# Patient Record
Sex: Female | Born: 1960 | Race: Black or African American | Hispanic: No | Marital: Married | Smoking: Never smoker
Health system: Southern US, Community
[De-identification: ages and names within clinical notes are randomized; demographics above are authoritative.]

## PROBLEM LIST (undated history)

## (undated) DIAGNOSIS — R748 Abnormal levels of other serum enzymes: Secondary | ICD-10-CM

## (undated) DIAGNOSIS — G4730 Sleep apnea, unspecified: Secondary | ICD-10-CM

## (undated) DIAGNOSIS — E739 Lactose intolerance, unspecified: Secondary | ICD-10-CM

## (undated) DIAGNOSIS — I10 Essential (primary) hypertension: Secondary | ICD-10-CM

## (undated) DIAGNOSIS — D649 Anemia, unspecified: Secondary | ICD-10-CM

## (undated) DIAGNOSIS — R3915 Urgency of urination: Secondary | ICD-10-CM

## (undated) DIAGNOSIS — Z8709 Personal history of other diseases of the respiratory system: Secondary | ICD-10-CM

## (undated) DIAGNOSIS — M75100 Unspecified rotator cuff tear or rupture of unspecified shoulder, not specified as traumatic: Secondary | ICD-10-CM

## (undated) DIAGNOSIS — M1990 Unspecified osteoarthritis, unspecified site: Secondary | ICD-10-CM

## (undated) HISTORY — PX: OTHER SURGICAL HISTORY: SHX169

## (undated) HISTORY — PX: HEMORRHOID SURGERY: SHX153

---

## 2004-11-29 HISTORY — PX: OTHER SURGICAL HISTORY: SHX169

## 2011-06-10 ENCOUNTER — Other Ambulatory Visit: Payer: Self-pay | Admitting: Surgical Oncology

## 2011-06-10 DIAGNOSIS — N6452 Nipple discharge: Secondary | ICD-10-CM

## 2011-07-01 ENCOUNTER — Ambulatory Visit
Admission: RE | Admit: 2011-07-01 | Discharge: 2011-07-01 | Disposition: A | Discharge status: Home / Self Care | Payer: BC Managed Care – PPO | Source: Ambulatory Visit | Attending: Surgical Oncology | Admitting: Surgical Oncology

## 2011-07-01 ENCOUNTER — Other Ambulatory Visit: Payer: Self-pay | Admitting: Surgical Oncology

## 2011-07-01 DIAGNOSIS — N6452 Nipple discharge: Secondary | ICD-10-CM

## 2014-09-05 ENCOUNTER — Ambulatory Visit: Payer: Self-pay | Admitting: Orthopedic Surgery

## 2014-09-06 ENCOUNTER — Encounter (HOSPITAL_COMMUNITY): Payer: Self-pay | Admitting: Pharmacy Technician

## 2014-09-09 ENCOUNTER — Ambulatory Visit: Payer: Self-pay | Admitting: Orthopedic Surgery

## 2014-09-09 NOTE — Patient Instructions (Addendum)
Olayinka Gathers  09/09/2014                           YOUR PROCEDURE IS SCHEDULED ON: 09/13/14                ENTER FROM [address] - GO TO [address]               LOOK FOR [facility]  / [employer]                              FOLLOW  SIGNS TO SHORT STAY CENTER                 ARRIVE AT SHORT STAY AT: 12:15 pm               CALL THIS NUMBER IF ANY PROBLEMS THE DAY OF SURGERY :               [phone]                                REMEMBER:   Do not eat food  AFTER MIDNIGHT              MAY HAVE CLEAR LIQUIDS UNTIL 8:15 AM     CLEAR LIQUID DIET   Foods Allowed                                                                     Foods Excluded  Coffee and tea, regular and decaf                             liquids that you cannot  Plain Jell-O in any flavor                                             see through such as: Fruit ices (not with fruit pulp)                                     milk, soups, orange juice  Iced Popsicles                                    All solid food Carbonated beverages, regular and diet                                    Cranberry, grape and apple juices Sports drinks like Gatorade Lightly seasoned clear broth or consume(fat free) Sugar, honey syrup  _____________________________________________________________________                    Take these medicines the morning of surgery with  A SIPS OF WATER :     GABAPENTIN / CEFTIN / MAY TAKE VICODIN IF NEEDED / MAY USE ALBUTEROL INHALER IF NEEDED    Do not wear jewelry, make-up   Do not wear lotions, powders, or perfumes.   Do not shave legs or underarms 12 hrs. before surgery (men may shave face)  Do not bring valuables to the hospital.  Contacts, dentures or bridgework may not be worn into surgery.  Leave suitcase in the car. After surgery it may be brought to your room.  For patients admitted to the hospital more than one night, checkout time is             11:00 AM                                                       The day of discharge.   Patients discharged the day of surgery will not be allowed to drive home.            If going home same day of surgery, must have someone stay with you              FIRST 24 hrs at home and arrange for some one to drive you              home from hospital.   ________________________________________________________________________                                                                                                  [facility] - PREPARING FOR SURGERY  Before surgery, you can play an important role.  Because skin is not sterile, your skin needs to be as free of germs as possible.  You can reduce the number of germs on your skin by washing with CHG (chlorahexidine gluconate) soap before surgery.  CHG is an antiseptic cleaner which kills germs and bonds with the skin to continue killing germs even after washing. Please DO NOT use if you have an allergy to CHG or antibacterial soaps.  If your skin becomes reddened/irritated stop using the CHG and inform your nurse when you arrive at Short Stay. Do not shave (including legs and underarms) for at least 48 hours prior to the first CHG shower.  You may shave your face. Please follow these instructions carefully:   1.  Shower with CHG Soap the night before surgery and the  morning of Surgery.   2.  If you choose to wash your hair, wash your hair first as usual with your  normal  Shampoo.   3.  After you shampoo, rinse your hair and body thoroughly to remove the  shampoo.                                         4.  Use CHG as you  would any other liquid soap.  You can apply chg directly  to the skin and wash . Gently wash with scrungie or clean wascloth    5.  Apply the CHG Soap to your body ONLY FROM THE NECK DOWN.   Do not use on open                           Wound or open sores. Avoid contact with eyes, ears mouth and genitals (private  parts).                        Genitals (private parts) with your normal soap.              6.  Wash thoroughly, paying special attention to the area where your surgery  will be performed.   7.  Thoroughly rinse your body with warm water from the neck down.   8.  DO NOT shower/wash with your normal soap after using and rinsing off  the CHG Soap .                9.  Pat yourself dry with a clean towel.             10.  Wear clean pajamas.             11.  Place clean sheets on your bed the night of your first shower and do not  sleep with pets.  Day of Surgery : Do not apply any lotions/deodorants the morning of surgery.  Please wear clean clothes to the hospital/surgery center.  FAILURE TO FOLLOW THESE INSTRUCTIONS MAY RESULT IN THE CANCELLATION OF YOUR SURGERY    PATIENT SIGNATURE_________________________________  ______________________________________________________________________     Rogelia Mire  An incentive spirometer is a tool that can help keep your lungs clear and active. This tool measures how well you are filling your lungs with each breath. Taking long deep breaths may help reverse or decrease the chance of developing breathing (pulmonary) problems (especially infection) following:  A long period of time when you are unable to move or be active. BEFORE THE PROCEDURE   If the spirometer includes an indicator to show your best effort, your nurse or respiratory therapist will set it to a desired goal.  If possible, sit up straight or lean slightly forward. Try not to slouch.  Hold the incentive spirometer in an upright position. INSTRUCTIONS FOR USE  1. Sit on the edge of your bed if possible, or sit up as far as you can in bed or on a chair. 2. Hold the incentive spirometer in an upright position. 3. Breathe out normally. 4. Place the mouthpiece in your mouth and seal your lips tightly around it. 5. Breathe in slowly and as deeply as possible, raising  the piston or the ball toward the top of the column. 6. Hold your breath for 3-5 seconds or for as long as possible. Allow the piston or ball to fall to the bottom of the column. 7. Remove the mouthpiece from your mouth and breathe out normally. 8. Rest for a few seconds and repeat Steps 1 through 7 at least 10 times every 1-2 hours when you are awake. Take your time and take a few normal breaths between deep breaths. 9. The spirometer may include an indicator to show your best effort. Use the indicator as a goal to work toward during each repetition. 10. After each  set of 10 deep breaths, practice coughing to be sure your lungs are clear. If you have an incision (the cut made at the time of surgery), support your incision when coughing by placing a pillow or rolled up towels firmly against it. Once you are able to get out of bed, walk around indoors and cough well. You may stop using the incentive spirometer when instructed by your caregiver.  RISKS AND COMPLICATIONS  Take your time so you do not get dizzy or light-headed.  If you are in pain, you may need to take or ask for pain medication before doing incentive spirometry. It is harder to take a deep breath if you are having pain. AFTER USE  Rest and breathe slowly and easily.  It can be helpful to keep track of a log of your progress. Your caregiver can provide you with a simple table to help with this. If you are using the spirometer at home, follow these instructions: SEEK MEDICAL CARE IF:   You are having difficultly using the spirometer.  You have trouble using the spirometer as often as instructed.  Your pain medication is not giving enough relief while using the spirometer.  You develop fever of 100.5 F (38.1 C) or higher. SEEK IMMEDIATE MEDICAL CARE IF:   You cough up bloody sputum that had not been present before.  You develop fever of 102 F (38.9 C) or greater.  You develop worsening pain at or near the incision  site. MAKE SURE YOU:   Understand these instructions.  Will watch your condition.  Will get help right away if you are not doing well or get worse. Document Released: 03/28/2007 Document Revised: 02/07/2012 Document Reviewed: 05/29/2007 [facility] Patient Information 2014 [employer], [place].   ________________________________________________________________________

## 2014-09-09 NOTE — H&P (Signed)
Molly Dean is an 53 y.o. female.   Chief Complaint: R shoulder pain HPI: The patient is a 53 year old female who presents today for follow up of their shoulder. The patient is being followed for their right shoulder pain. They are 5 1/2 months out from when symptoms began. Symptoms reported today include: pain. and report their pain level to be severe. Current treatment includes: Robaxin. The following medication has been used for pain control: Hydrocodone. The patient presents today following MRI.  No past medical history on file.  No past surgical history on file.  No family history on file. Social History:  has no tobacco, alcohol, and drug history on file.  Allergies: No Known Allergies   (Not in a hospital admission)  No results found for this or any previous visit (from the past 48 hour(s)). No results found.  Review of Systems  Constitutional: Negative.   HENT: Negative.   Eyes: Negative.   Respiratory: Negative.   Cardiovascular: Negative.   Gastrointestinal: Negative.   Genitourinary: Negative.   Musculoskeletal: Positive for joint pain.  Skin: Negative.   Neurological: Negative.   Endo/Heme/Allergies: Negative.   Psychiatric/Behavioral: Negative.     There were no vitals taken for this visit. Physical Exam  Constitutional: She is oriented to person, place, and time. She appears well-developed and well-nourished.  HENT:  Head: Normocephalic and atraumatic.  Eyes: Conjunctivae and EOM are normal. Pupils are equal, round, and reactive to light.  Neck: Normal range of motion. Neck supple.  Cardiovascular: Normal rate and regular rhythm.   Respiratory: Effort normal and breath sounds normal.  GI: Soft. Bowel sounds are normal.  Musculoskeletal:  On examination of the shoulder she is weak on external rotation. There is a positive impingement sign. Decreased internal rotation. Tender over the [facility] joint. There is a positive cross over maneuver.   Neurological:  She is alert and oriented to person, place, and time. She has normal reflexes.  Skin: Skin is warm and dry.  Psychiatric: She has a normal mood and affect.    The MRI of the shoulder shows a full thickness tear of the supraspinatus with mild to moderate atrophy, retracted. Full thickness tear to the infraspinatus. Severe AC arthrosis. Biceps tendinosis.  Assessment/Plan R shoulder RCT, AC arthrosis Full thickness tear, supraspinatus, infraspinatus. Associated AC arthrosis.  In terms of the shoulder, we discussed a rotator cuff repair and distal clavicle resection.  I had a long discussion with the patient concerning the risks and benefits of a right rotator cuff repair, including bleeding, infection, prolonged postoperative recovery, which may require 3 to 5 months until maximum medical improvement. Overnight procedure with initiation of early passive range of motion within physical therapy. Avoid any active motion for the first 6 weeks. This is all in an effort to avoid recurrent tear of the rotator cuff and adhesive capsulitis. Return to work without use of the arm can be obtained following 2 weeks. However, driving will be a challenge. We also discussed the possibility of requiring implants including bone anchors, as well as an Allograft patch graft if a massive rotator cuff tear is encountered. Removal of any bones for spurs as well as bursitis will be performed during the procedure. Also any associated anesthetic complications as well.   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 No PE. This was during her pregnancy, none since. She was on Heparin at that time. We may have her post operatively on released aspirin.  We discussed  her problems with her at length and her husband. She needs time out of work. Overnight stay is probable.  I had a long discussion with the patient concerning the risks and benefits of a rotator cuff repair, including bleeding, infection, prolonged postoperative  recovery, which may require 3 to 5 months until maximum medical improvement. Overight procedure with initiation of early passive range of motion within physical therapy. Avoid any active motion for the first six weeks. This is all in an effort to avoid recurrent tear of the rotator cuff and adhesive capsulitis. Return to work without use of the arm can be obtained following two weeks. However, driving will be a challenge. We also discussed the possibility of requiring implants including bone anchors,as well as an Allograft patch graft if a massive rotator cuff tear is encountered. Removal of any bones for spurs as well as bursitis will be performed during the procedure and also any associated anesthetic complications as well.  Plan right shoulder mini-open RCR, DCR  Daimian Sudberry M. PA-C for Dr. Beane 09/09/2014, 12:40 PM    

## 2014-09-11 ENCOUNTER — Encounter (INDEPENDENT_AMBULATORY_CARE_PROVIDER_SITE_OTHER): Payer: Self-pay

## 2014-09-11 ENCOUNTER — Encounter (HOSPITAL_COMMUNITY): Payer: Self-pay

## 2014-09-11 ENCOUNTER — Encounter (HOSPITAL_COMMUNITY)
Admission: RE | Admit: 2014-09-11 | Discharge: 2014-09-11 | Disposition: A | Discharge status: Home / Self Care | Payer: BC Managed Care – PPO | Source: Ambulatory Visit | Attending: Specialist | Admitting: Specialist

## 2014-09-11 ENCOUNTER — Ambulatory Visit (HOSPITAL_COMMUNITY)
Admission: RE | Admit: 2014-09-11 | Discharge: 2014-09-11 | Disposition: A | Discharge status: Home / Self Care | Payer: BC Managed Care – PPO | Source: Ambulatory Visit | Attending: Anesthesiology | Admitting: Anesthesiology

## 2014-09-11 DIAGNOSIS — M75120 Complete rotator cuff tear or rupture of unspecified shoulder, not specified as traumatic: Secondary | ICD-10-CM | POA: Diagnosis not present

## 2014-09-11 DIAGNOSIS — I10 Essential (primary) hypertension: Secondary | ICD-10-CM

## 2014-09-11 DIAGNOSIS — Z01818 Encounter for other preprocedural examination: Secondary | ICD-10-CM | POA: Insufficient documentation

## 2014-09-11 HISTORY — DX: Unspecified osteoarthritis, unspecified site: M19.90

## 2014-09-11 HISTORY — DX: Urgency of urination: R39.15

## 2014-09-11 HISTORY — DX: Lactose intolerance, unspecified: E73.9

## 2014-09-11 HISTORY — DX: Sleep apnea, unspecified: G47.30

## 2014-09-11 HISTORY — DX: Essential (primary) hypertension: I10

## 2014-09-11 HISTORY — DX: Abnormal levels of other serum enzymes: R74.8

## 2014-09-11 HISTORY — DX: Unspecified rotator cuff tear or rupture of unspecified shoulder, not specified as traumatic: M75.100

## 2014-09-11 HISTORY — DX: Personal history of other diseases of the respiratory system: Z87.09

## 2014-09-11 HISTORY — DX: Anemia, unspecified: D64.9

## 2014-09-11 LAB — COMPREHENSIVE METABOLIC PANEL
ALT: 15 U/L (ref 0–35)
AST: 19 U/L (ref 0–37)
Albumin: 4 g/dL (ref 3.5–5.2)
Alkaline Phosphatase: 71 U/L (ref 39–117)
Anion gap: 12 (ref 5–15)
BUN: 13 mg/dL (ref 6–23)
CHLORIDE: 102 meq/L (ref 96–112)
CO2: 29 meq/L (ref 19–32)
Calcium: 9.6 mg/dL (ref 8.4–10.5)
Creatinine, Ser: 0.69 mg/dL (ref 0.50–1.10)
GFR calc Af Amer: >90 mL/min (ref >90)
Glucose, Bld: 84 mg/dL (ref 70–99)
POTASSIUM: 3.7 meq/L (ref 3.7–5.3)
SODIUM: 143 meq/L (ref 137–147)
Total Protein: 7.3 g/dL (ref 6.0–8.3)

## 2014-09-11 LAB — CBC
HEMATOCRIT: 38 % (ref 36.0–46.0)
HEMOGLOBIN: 11.9 g/dL — AB (ref 12.0–15.0)
MCH: 23 pg — AB (ref 26.0–34.0)
MCHC: 31.3 g/dL (ref 30.0–36.0)
MCV: 73.4 fL — AB (ref 78.0–100.0)
Platelets: 249 10*3/uL (ref 150–400)
RBC: 5.18 MIL/uL — ABNORMAL HIGH (ref 3.87–5.11)
RDW: 14.8 % (ref 11.5–15.5)
WBC: 4.9 10*3/uL (ref 4.0–10.5)

## 2014-09-11 NOTE — Progress Notes (Signed)
EKG reviewed by Dr. Germeroth - no action needed 

## 2014-09-13 ENCOUNTER — Ambulatory Visit (HOSPITAL_COMMUNITY)
Admission: RE | Admit: 2014-09-13 | Discharge: 2014-09-14 | Disposition: A | Discharge status: Home / Self Care | Payer: BC Managed Care – PPO | Source: Ambulatory Visit | Attending: Specialist | Admitting: Specialist

## 2014-09-13 ENCOUNTER — Encounter (HOSPITAL_COMMUNITY): Payer: Self-pay | Admitting: *Deleted

## 2014-09-13 ENCOUNTER — Encounter (HOSPITAL_COMMUNITY): Payer: BC Managed Care – PPO | Admitting: Anesthesiology

## 2014-09-13 ENCOUNTER — Encounter (HOSPITAL_COMMUNITY)
Admission: RE | Disposition: A | Discharge status: Home / Self Care | Payer: Self-pay | Source: Ambulatory Visit | Attending: Specialist

## 2014-09-13 ENCOUNTER — Ambulatory Visit (HOSPITAL_COMMUNITY): Payer: BC Managed Care – PPO | Admitting: Anesthesiology

## 2014-09-13 DIAGNOSIS — Z86718 Personal history of other venous thrombosis and embolism: Secondary | ICD-10-CM | POA: Diagnosis not present

## 2014-09-13 DIAGNOSIS — X58XXXA Exposure to other specified factors, initial encounter: Secondary | ICD-10-CM | POA: Diagnosis not present

## 2014-09-13 DIAGNOSIS — M19011 Primary osteoarthritis, right shoulder: Secondary | ICD-10-CM | POA: Diagnosis not present

## 2014-09-13 DIAGNOSIS — M75120 Complete rotator cuff tear or rupture of unspecified shoulder, not specified as traumatic: Secondary | ICD-10-CM | POA: Diagnosis present

## 2014-09-13 DIAGNOSIS — M75101 Unspecified rotator cuff tear or rupture of right shoulder, not specified as traumatic: Secondary | ICD-10-CM

## 2014-09-13 HISTORY — PX: SHOULDER OPEN ROTATOR CUFF REPAIR: SHX2407

## 2014-09-13 LAB — CREATININE, SERUM: CREATININE: 0.76 mg/dL (ref 0.50–1.10)

## 2014-09-13 LAB — CBC
HCT: 34.8 % — ABNORMAL LOW (ref 36.0–46.0)
Hemoglobin: 11 g/dL — ABNORMAL LOW (ref 12.0–15.0)
MCH: 23.1 pg — ABNORMAL LOW (ref 26.0–34.0)
MCHC: 31.6 g/dL (ref 30.0–36.0)
MCV: 73.1 fL — ABNORMAL LOW (ref 78.0–100.0)
Platelets: 216 10*3/uL (ref 150–400)
RBC: 4.76 MIL/uL (ref 3.87–5.11)
RDW: 14.8 % (ref 11.5–15.5)
WBC: 6 10*3/uL (ref 4.0–10.5)

## 2014-09-13 SURGERY — REPAIR, ROTATOR CUFF, OPEN
Anesthesia: General | Site: Shoulder | Laterality: Right

## 2014-09-13 MED ORDER — DOCUSATE SODIUM 100 MG PO CAPS
100.0000 mg | ORAL_CAPSULE | Freq: Two times a day (BID) | ORAL | Status: DC
  Administered 2014-09-13 – 2014-09-14 (×2): 100 mg via ORAL

## 2014-09-13 MED ORDER — CEFAZOLIN SODIUM-DEXTROSE 2-3 GM-% IV SOLR
2.0000 g | Freq: Four times a day (QID) | INTRAVENOUS | Status: AC
  Administered 2014-09-13 – 2014-09-14 (×3): 2 g via INTRAVENOUS
  Filled 2014-09-13 (×3): qty 50

## 2014-09-13 MED ORDER — SODIUM CHLORIDE 0.9 % IR SOLN
Status: AC
  Filled 2014-09-13: qty 1

## 2014-09-13 MED ORDER — METOCLOPRAMIDE HCL 10 MG PO TABS: 5.0000 mg | ORAL_TABLET | Freq: Three times a day (TID) | ORAL | Status: DC | PRN

## 2014-09-13 MED ORDER — OXYCODONE-ACETAMINOPHEN 7.5-325 MG PO TABS: 1.0000 | ORAL_TABLET | ORAL | Status: AC | PRN

## 2014-09-13 MED ORDER — METHOCARBAMOL 1000 MG/10ML IJ SOLN
500.0000 mg | Freq: Four times a day (QID) | INTRAMUSCULAR | Status: DC | PRN
  Administered 2014-09-13 (×2): 500 mg via INTRAVENOUS
  Filled 2014-09-13 (×2): qty 5

## 2014-09-13 MED ORDER — CEFAZOLIN SODIUM-DEXTROSE 2-3 GM-% IV SOLR
2.0000 g | INTRAVENOUS | Status: DC
Start: 2014-09-14 — End: 2014-09-13

## 2014-09-13 MED ORDER — BUPIVACAINE-EPINEPHRINE (PF) 0.5% -1:200000 IJ SOLN
INTRAMUSCULAR | Status: AC
  Filled 2014-09-13: qty 30

## 2014-09-13 MED ORDER — METHOCARBAMOL 500 MG PO TABS: 500.0000 mg | ORAL_TABLET | Freq: Four times a day (QID) | ORAL | Status: DC | PRN

## 2014-09-13 MED ORDER — NEOSTIGMINE METHYLSULFATE 10 MG/10ML IV SOLN
INTRAVENOUS | Status: DC | PRN
  Administered 2014-09-13: 4 mg via INTRAVENOUS

## 2014-09-13 MED ORDER — CLINDAMYCIN PHOSPHATE 900 MG/50ML IV SOLN
INTRAVENOUS | Status: AC
  Filled 2014-09-13: qty 50

## 2014-09-13 MED ORDER — SUFENTANIL CITRATE 50 MCG/ML IV SOLN
INTRAVENOUS | Status: AC
  Filled 2014-09-13: qty 1

## 2014-09-13 MED ORDER — SUFENTANIL CITRATE 50 MCG/ML IV SOLN
INTRAVENOUS | Status: DC | PRN
  Administered 2014-09-13: 20 ug via INTRAVENOUS
  Administered 2014-09-13 (×2): 10 ug via INTRAVENOUS

## 2014-09-13 MED ORDER — HYDROMORPHONE HCL 1 MG/ML IJ SOLN
0.5000 mg | INTRAMUSCULAR | Status: DC | PRN
  Administered 2014-09-13: 0.5 mg via INTRAVENOUS
  Administered 2014-09-14: 1 mg via INTRAVENOUS
  Filled 2014-09-13 (×3): qty 1

## 2014-09-13 MED ORDER — PROMETHAZINE HCL 25 MG/ML IJ SOLN: 6.2500 mg | INTRAMUSCULAR | Status: DC | PRN

## 2014-09-13 MED ORDER — DEXAMETHASONE SODIUM PHOSPHATE 10 MG/ML IJ SOLN
INTRAMUSCULAR | Status: DC | PRN
  Administered 2014-09-13: 10 mg via INTRAVENOUS

## 2014-09-13 MED ORDER — ENOXAPARIN SODIUM 40 MG/0.4ML ~~LOC~~ SOLN
40.0000 mg | SUBCUTANEOUS | Status: DC
  Administered 2014-09-14: 40 mg via SUBCUTANEOUS
  Filled 2014-09-13 (×2): qty 0.4

## 2014-09-13 MED ORDER — SODIUM CHLORIDE 0.9 % IJ SOLN
INTRAMUSCULAR | Status: AC
  Filled 2014-09-13: qty 10

## 2014-09-13 MED ORDER — METHOCARBAMOL 500 MG PO TABS: 500.0000 mg | ORAL_TABLET | Freq: Three times a day (TID) | ORAL | Status: AC | PRN

## 2014-09-13 MED ORDER — LISINOPRIL 20 MG PO TABS
20.0000 mg | ORAL_TABLET | Freq: Every day | ORAL | Status: DC
  Administered 2014-09-13: 20 mg via ORAL
  Filled 2014-09-13 (×2): qty 1

## 2014-09-13 MED ORDER — PHENTERMINE HCL 37.5 MG PO TABS: 37.5000 mg | ORAL_TABLET | ORAL | Status: DC

## 2014-09-13 MED ORDER — LIP MEDEX EX OINT
TOPICAL_OINTMENT | CUTANEOUS | Status: AC
  Filled 2014-09-13: qty 7

## 2014-09-13 MED ORDER — PROPOFOL 10 MG/ML IV BOLUS
INTRAVENOUS | Status: DC | PRN
  Administered 2014-09-13: 160 mg via INTRAVENOUS

## 2014-09-13 MED ORDER — LORATADINE 10 MG PO TABS
10.0000 mg | ORAL_TABLET | Freq: Every day | ORAL | Status: DC
  Administered 2014-09-14: 10 mg via ORAL
  Filled 2014-09-13 (×2): qty 1

## 2014-09-13 MED ORDER — DEXAMETHASONE SODIUM PHOSPHATE 10 MG/ML IJ SOLN
INTRAMUSCULAR | Status: AC
  Filled 2014-09-13: qty 1

## 2014-09-13 MED ORDER — HYDROMORPHONE HCL 1 MG/ML IJ SOLN
0.2500 mg | INTRAMUSCULAR | Status: DC | PRN
  Administered 2014-09-13 (×3): 0.5 mg via INTRAVENOUS

## 2014-09-13 MED ORDER — MIDAZOLAM HCL 2 MG/2ML IJ SOLN
INTRAMUSCULAR | Status: AC
  Filled 2014-09-13: qty 2

## 2014-09-13 MED ORDER — KCL IN DEXTROSE-NACL 20-5-0.45 MEQ/L-%-% IV SOLN
INTRAVENOUS | Status: DC
  Administered 2014-09-13: 19:00:00 via INTRAVENOUS
  Filled 2014-09-13 (×2): qty 1000

## 2014-09-13 MED ORDER — ONDANSETRON HCL 4 MG/2ML IJ SOLN
INTRAMUSCULAR | Status: AC
Start: 2014-09-13 — End: 2014-09-13
  Filled 2014-09-13: qty 2

## 2014-09-13 MED ORDER — OXYCODONE-ACETAMINOPHEN 5-325 MG PO TABS: 1.0000 | ORAL_TABLET | ORAL | Status: DC | PRN

## 2014-09-13 MED ORDER — EPHEDRINE SULFATE 50 MG/ML IJ SOLN
INTRAMUSCULAR | Status: DC | PRN
  Administered 2014-09-13 (×3): 10 mg via INTRAVENOUS

## 2014-09-13 MED ORDER — CLINDAMYCIN PHOSPHATE 900 MG/50ML IV SOLN: 900.0000 mg | INTRAVENOUS | Status: DC

## 2014-09-13 MED ORDER — HYDROCODONE-ACETAMINOPHEN 5-325 MG PO TABS
1.0000 | ORAL_TABLET | ORAL | Status: DC | PRN
  Administered 2014-09-13 – 2014-09-14 (×4): 2 via ORAL
  Filled 2014-09-13 (×4): qty 2

## 2014-09-13 MED ORDER — BUPIVACAINE-EPINEPHRINE 0.5% -1:200000 IJ SOLN
INTRAMUSCULAR | Status: DC | PRN
  Administered 2014-09-13: 20 mL

## 2014-09-13 MED ORDER — HYDROCHLOROTHIAZIDE 25 MG PO TABS
25.0000 mg | ORAL_TABLET | Freq: Every day | ORAL | Status: DC
  Administered 2014-09-13: 25 mg via ORAL
  Filled 2014-09-13 (×2): qty 1

## 2014-09-13 MED ORDER — METOCLOPRAMIDE HCL 5 MG/ML IJ SOLN
5.0000 mg | Freq: Three times a day (TID) | INTRAMUSCULAR | Status: DC | PRN
  Administered 2014-09-13 – 2014-09-14 (×2): 10 mg via INTRAVENOUS
  Filled 2014-09-13 (×2): qty 2

## 2014-09-13 MED ORDER — ACETAMINOPHEN 650 MG RE SUPP: 650.0000 mg | Freq: Four times a day (QID) | RECTAL | Status: DC | PRN

## 2014-09-13 MED ORDER — ASPIRIN EC 81 MG PO TBEC
81.0000 mg | DELAYED_RELEASE_TABLET | Freq: Every day | ORAL | Status: DC
  Administered 2014-09-13: 81 mg via ORAL
  Filled 2014-09-13 (×2): qty 1

## 2014-09-13 MED ORDER — CEFAZOLIN SODIUM-DEXTROSE 2-3 GM-% IV SOLR
2.0000 g | INTRAVENOUS | Status: AC
  Administered 2014-09-13: 2 g via INTRAVENOUS

## 2014-09-13 MED ORDER — LIDOCAINE HCL (CARDIAC) 20 MG/ML IV SOLN
INTRAVENOUS | Status: DC | PRN
  Administered 2014-09-13: 100 mg via INTRAVENOUS

## 2014-09-13 MED ORDER — ONDANSETRON HCL 4 MG PO TABS: 4.0000 mg | ORAL_TABLET | Freq: Four times a day (QID) | ORAL | Status: DC | PRN

## 2014-09-13 MED ORDER — PROPOFOL 10 MG/ML IV BOLUS
INTRAVENOUS | Status: AC
  Filled 2014-09-13: qty 20

## 2014-09-13 MED ORDER — MIDAZOLAM HCL 5 MG/5ML IJ SOLN
INTRAMUSCULAR | Status: DC | PRN
  Administered 2014-09-13: 2 mg via INTRAVENOUS

## 2014-09-13 MED ORDER — CLINDAMYCIN PHOSPHATE 900 MG/50ML IV SOLN
900.0000 mg | INTRAVENOUS | Status: AC
  Administered 2014-09-13: 900 mg via INTRAVENOUS

## 2014-09-13 MED ORDER — SENNOSIDES-DOCUSATE SODIUM 8.6-50 MG PO TABS: 1.0000 | ORAL_TABLET | Freq: Every evening | ORAL | Status: DC | PRN

## 2014-09-13 MED ORDER — PHENOL 1.4 % MT LIQD: 1.0000 | OROMUCOSAL | Status: DC | PRN

## 2014-09-13 MED ORDER — LACTATED RINGERS IV SOLN
INTRAVENOUS | Status: DC
  Administered 2014-09-13 (×2): via INTRAVENOUS

## 2014-09-13 MED ORDER — HYDROMORPHONE HCL 1 MG/ML IJ SOLN
INTRAMUSCULAR | Status: AC
  Administered 2014-09-13: 1 mg
  Filled 2014-09-13: qty 1

## 2014-09-13 MED ORDER — ALBUTEROL SULFATE (2.5 MG/3ML) 0.083% IN NEBU: 3.0000 mL | INHALATION_SOLUTION | Freq: Four times a day (QID) | RESPIRATORY_TRACT | Status: DC | PRN

## 2014-09-13 MED ORDER — FESOTERODINE FUMARATE ER 4 MG PO TB24
4.0000 mg | ORAL_TABLET | Freq: Every day | ORAL | Status: DC
  Administered 2014-09-14: 4 mg via ORAL
  Filled 2014-09-13 (×2): qty 1

## 2014-09-13 MED ORDER — HYDROMORPHONE HCL 1 MG/ML IJ SOLN
INTRAMUSCULAR | Status: AC
  Filled 2014-09-13: qty 1

## 2014-09-13 MED ORDER — GLYCOPYRROLATE 0.2 MG/ML IJ SOLN
INTRAMUSCULAR | Status: DC | PRN
  Administered 2014-09-13: .6 mg via INTRAVENOUS

## 2014-09-13 MED ORDER — ACETAMINOPHEN 325 MG PO TABS: 650.0000 mg | ORAL_TABLET | Freq: Four times a day (QID) | ORAL | Status: DC | PRN

## 2014-09-13 MED ORDER — ONDANSETRON HCL 4 MG/2ML IJ SOLN
INTRAMUSCULAR | Status: DC | PRN
  Administered 2014-09-13: 4 mg via INTRAVENOUS

## 2014-09-13 MED ORDER — CISATRACURIUM BESYLATE (PF) 10 MG/5ML IV SOLN
INTRAVENOUS | Status: DC | PRN
  Administered 2014-09-13: 5 mg via INTRAVENOUS

## 2014-09-13 MED ORDER — SUCCINYLCHOLINE CHLORIDE 20 MG/ML IJ SOLN
INTRAMUSCULAR | Status: DC | PRN
  Administered 2014-09-13: 100 mg via INTRAVENOUS

## 2014-09-13 MED ORDER — HYDROCODONE-ACETAMINOPHEN 5-325 MG PO TABS: 1.0000 | ORAL_TABLET | ORAL | Status: AC | PRN

## 2014-09-13 MED ORDER — KETOROLAC TROMETHAMINE 30 MG/ML IJ SOLN
INTRAMUSCULAR | Status: DC | PRN
  Administered 2014-09-13: 30 mg via INTRAVENOUS

## 2014-09-13 MED ORDER — ONDANSETRON HCL 4 MG/2ML IJ SOLN: 4.0000 mg | Freq: Four times a day (QID) | INTRAMUSCULAR | Status: DC | PRN

## 2014-09-13 MED ORDER — CEFAZOLIN SODIUM-DEXTROSE 2-3 GM-% IV SOLR
INTRAVENOUS | Status: AC
Start: 2014-09-13 — End: 2014-09-13
  Filled 2014-09-13: qty 50

## 2014-09-13 MED ORDER — MENTHOL 3 MG MT LOZG: 1.0000 | LOZENGE | OROMUCOSAL | Status: DC | PRN

## 2014-09-13 MED ORDER — CISATRACURIUM BESYLATE 20 MG/10ML IV SOLN
INTRAVENOUS | Status: AC
  Filled 2014-09-13: qty 10

## 2014-09-13 MED ORDER — EPHEDRINE SULFATE 50 MG/ML IJ SOLN
INTRAMUSCULAR | Status: AC
  Filled 2014-09-13: qty 1

## 2014-09-13 MED ORDER — GABAPENTIN 300 MG PO CAPS
300.0000 mg | ORAL_CAPSULE | Freq: Three times a day (TID) | ORAL | Status: DC
  Administered 2014-09-13 – 2014-09-14 (×2): 300 mg via ORAL
  Filled 2014-09-13 (×5): qty 1

## 2014-09-13 MED ORDER — ACETAMINOPHEN 10 MG/ML IV SOLN
1000.0000 mg | Freq: Once | INTRAVENOUS | Status: AC
  Administered 2014-09-13: 1000 mg via INTRAVENOUS
  Filled 2014-09-13: qty 100

## 2014-09-13 MED ORDER — LISINOPRIL-HYDROCHLOROTHIAZIDE 20-25 MG PO TABS: 1.0000 | ORAL_TABLET | Freq: Every day | ORAL | Status: DC

## 2014-09-13 MED ORDER — SODIUM CHLORIDE 0.9 % IR SOLN
Status: DC | PRN
  Administered 2014-09-13: 15:00:00

## 2014-09-13 MED ORDER — LIDOCAINE HCL (CARDIAC) 20 MG/ML IV SOLN
INTRAVENOUS | Status: AC
  Filled 2014-09-13: qty 5

## 2014-09-13 SURGICAL SUPPLY — 46 items
ANCHOR NEEDLE 9/16 CIR SZ 8 (NEEDLE) IMPLANT
ANCHOR PEEK 4.75X19.1 SWLK C (Anchor) ×6 IMPLANT
BAG ZIPLOCK 12X15 (MISCELLANEOUS) IMPLANT
BLADE OSCILLATING/SAGITTAL (BLADE) ×2
BLADE SW THK.38XMED LNG THN (BLADE) ×1 IMPLANT
CLEANER TIP ELECTROSURG 2X2 (MISCELLANEOUS) ×3 IMPLANT
CLOSURE WOUND 1/2 X4 (GAUZE/BANDAGES/DRESSINGS) ×1
CLOTH 2% CHLOROHEXIDINE 3PK (PERSONAL CARE ITEMS) ×3 IMPLANT
DRAPE POUCH INSTRU U-SHP 10X18 (DRAPES) ×3 IMPLANT
DRAPE SPLIT 77X100IN (DRAPES) ×3 IMPLANT
DRSG AQUACEL AG ADV 3.5X 4 (GAUZE/BANDAGES/DRESSINGS) IMPLANT
DRSG AQUACEL AG ADV 3.5X 6 (GAUZE/BANDAGES/DRESSINGS) ×3 IMPLANT
DURAPREP 26ML APPLICATOR (WOUND CARE) ×3 IMPLANT
ELECT NEEDLE TIP 2.8 STRL (NEEDLE) ×3 IMPLANT
ELECT REM PT RETURN 9FT ADLT (ELECTROSURGICAL) ×3
ELECTRODE REM PT RTRN 9FT ADLT (ELECTROSURGICAL) ×1 IMPLANT
GLOVE BIOGEL PI IND STRL 7.5 (GLOVE) ×1 IMPLANT
GLOVE BIOGEL PI IND STRL 8 (GLOVE) IMPLANT
GLOVE BIOGEL PI INDICATOR 7.5 (GLOVE) ×2
GLOVE BIOGEL PI INDICATOR 8 (GLOVE)
GLOVE SURG SS PI 7.5 STRL IVOR (GLOVE) ×3 IMPLANT
GLOVE SURG SS PI 8.0 STRL IVOR (GLOVE) ×6 IMPLANT
GOWN STRL REUS W/TWL XL LVL3 (GOWN DISPOSABLE) ×6 IMPLANT
KIT BASIN OR (CUSTOM PROCEDURE TRAY) ×3 IMPLANT
KIT POSITION SHOULDER SCHLEI (MISCELLANEOUS) ×3 IMPLANT
MANIFOLD NEPTUNE II (INSTRUMENTS) ×3 IMPLANT
NEEDLE SCORPION MULTI FIRE (NEEDLE) ×3 IMPLANT
PACK SHOULDER CUSTOM OPM052 (CUSTOM PROCEDURE TRAY) ×3 IMPLANT
POSITIONER SURGICAL ARM (MISCELLANEOUS) ×3 IMPLANT
PUSHLOCK PEEK 4.5X24 (Orthopedic Implant) ×6 IMPLANT
SLING ARM IMMOBILIZER LRG (SOFTGOODS) IMPLANT
SLING ULTRA II L (ORTHOPEDIC SUPPLIES) IMPLANT
STRIP CLOSURE SKIN 1/2X4 (GAUZE/BANDAGES/DRESSINGS) ×2 IMPLANT
SUCTION FRAZIER 12FR DISP (SUCTIONS) ×3 IMPLANT
SUT BONE WAX W31G (SUTURE) IMPLANT
SUT ETHIBOND NAB CT1 #1 30IN (SUTURE) IMPLANT
SUT FIBERWIRE #2 38 T-5 BLUE (SUTURE)
SUT PROLENE 3 0 PS 2 (SUTURE) ×3 IMPLANT
SUT TIGER TAPE 7 IN WHITE (SUTURE) ×3 IMPLANT
SUT VIC AB 0 CT1 27 (SUTURE) ×4
SUT VIC AB 0 CT1 27XBRD ANTBC (SUTURE) ×2 IMPLANT
SUT VIC AB 1-0 CT2 27 (SUTURE) ×3 IMPLANT
SUT VIC AB 2-0 CT2 27 (SUTURE) ×3 IMPLANT
SUT VICRYL 0-0 OS 2 NEEDLE (SUTURE) IMPLANT
SUTURE FIBERWR #2 38 T-5 BLUE (SUTURE) IMPLANT
TAPE FIBER 2MM 7IN #2 BLUE (SUTURE) ×3 IMPLANT

## 2014-09-13 NOTE — Brief Op Note (Signed)
09/13/2014  3:44 PM  PATIENT:  Molly Dean  53 y.o. 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  TOURNIQUET:  * No tourniquets in log *  DICTATION: [id number]         PLAN OF CARE: Admit to inpatient   PATIENT DISPOSITION:  PACU - hemodynamically stable.   Delay start of Pharmacological VTE agent (>24hrs) due to surgical blood loss or risk of bleeding: noAttention

## 2014-09-13 NOTE — Anesthesia Postprocedure Evaluation (Signed)
  Anesthesia Post-op Note  Patient: Molly Dean  Procedure(s) Performed: Procedure(s) (LRB): RIGHT SHOULDER MINI OPEN ROTATOR CUFF REPAIR WITH DISTAL CLAVICLE RESECTION (Right)  Patient Location: PACU  Anesthesia Type: General  Level of Consciousness: awake and alert   Airway and Oxygen Therapy: Patient Spontanous Breathing  Post-op Pain: mild  Post-op Assessment: Post-op Vital signs reviewed, Patient's Cardiovascular Status Stable, Respiratory Function Stable, Patent Airway and No signs of Nausea or vomiting  Last Vitals:  Filed Vitals:   09/13/14 1709  BP: 114/68  Pulse: 84  Temp: 36.3 C  Resp: 14    Post-op Vital Signs: stable   Complications: No apparent anesthesia complications

## 2014-09-13 NOTE — Anesthesia Procedure Notes (Signed)
Procedure Name: Intubation Date/Time: 09/13/2014 2:28 PM Performed by: Leroy LibmanEARDON, Demaurion Dicioccio L Patient Re-evaluated:Patient Re-evaluated prior to inductionOxygen Delivery Method: Circle system utilized Preoxygenation: Pre-oxygenation with 100% oxygen Intubation Type: IV induction Ventilation: Mask ventilation without difficulty and Oral airway inserted - appropriate to patient size Laryngoscope Size: Hyacinth MeekerMiller and 2 Grade View: Grade I Tube type: Oral Tube size: 7.5 mm Number of attempts: 1 Airway Equipment and Method: Stylet Placement Confirmation: ETT inserted through vocal cords under direct vision,  breath sounds checked- equal and bilateral and positive ETCO2 Secured at: 22 cm Tube secured with: Tape Dental Injury: Teeth and Oropharynx as per pre-operative assessment

## 2014-09-13 NOTE — Discharge Instructions (Signed)
°  Use sling at times except when exercising or showering Aquacel dressing may remain in place until follow up. May shower with aquacel in place. If it becomes saturated or peels off at the edges, may remove the dressing but leave steri strips in place. If the aquacel dressing is removed, leave steri strips in place and place gauze and tape dressing which should be kept clean and dry and changed daily No driving for 4-6 weeks No lifting for 6 weeks operative arm Pendulum exercises as instructed. Ok to move wrist,elbow, and hand. See Dr. Shelle IronBeane in 10-14 days. Take one aspirin per day with a meal if not on a blood thinner or allergic to aspirin.

## 2014-09-13 NOTE — Transfer of Care (Signed)
Immediate Anesthesia Transfer of Care Note  Patient: Molly Dean  Procedure(s) Performed: Procedure(s): RIGHT SHOULDER MINI OPEN ROTATOR CUFF REPAIR WITH DISTAL CLAVICLE RESECTION (Right)  Patient Location: PACU  Anesthesia Type:General  Level of Consciousness: awake  Airway & Oxygen Therapy: Patient Spontanous Breathing and Patient connected to face mask oxygen  Post-op Assessment: Report given to PACU RN and Post -op Vital signs reviewed and stable  Post vital signs: Reviewed and stable  Complications: No apparent anesthesia complications

## 2014-09-13 NOTE — H&P (View-Only) (Signed)
Molly Dean is an 53 y.o. female.   Chief Complaint: R shoulder pain HPI: The patient is a 53 year old female who presents today for follow up of their shoulder. The patient is being followed for their right shoulder pain. They are 5 1/2 months out from when symptoms began. Symptoms reported today include: pain. and report their pain level to be severe. Current treatment includes: Robaxin. The following medication has been used for pain control: Hydrocodone. The patient presents today following MRI.  No past medical history on file.  No past surgical history on file.  No family history on file. Social History:  has no tobacco, alcohol, and drug history on file.  Allergies: No Known Allergies   (Not in a hospital admission)  No results found for this or any previous visit (from the past 48 hour(s)). No results found.  Review of Systems  Constitutional: Negative.   HENT: Negative.   Eyes: Negative.   Respiratory: Negative.   Cardiovascular: Negative.   Gastrointestinal: Negative.   Genitourinary: Negative.   Musculoskeletal: Positive for joint pain.  Skin: Negative.   Neurological: Negative.   Endo/Heme/Allergies: Negative.   Psychiatric/Behavioral: Negative.     There were no vitals taken for this visit. Physical Exam  Constitutional: She is oriented to person, place, and time. She appears well-developed and well-nourished.  HENT:  Head: Normocephalic and atraumatic.  Eyes: Conjunctivae and EOM are normal. Pupils are equal, round, and reactive to light.  Neck: Normal range of motion. Neck supple.  Cardiovascular: Normal rate and regular rhythm.   Respiratory: Effort normal and breath sounds normal.  GI: Soft. Bowel sounds are normal.  Musculoskeletal:  On examination of the shoulder she is weak on external rotation. There is a positive impingement sign. Decreased internal rotation. Tender over the [facility] joint. There is a positive cross over maneuver.   Neurological:  She is alert and oriented to person, place, and time. She has normal reflexes.  Skin: Skin is warm and dry.  Psychiatric: She has a normal mood and affect.    The MRI of the shoulder shows a full thickness tear of the supraspinatus with mild to moderate atrophy, retracted. Full thickness tear to the infraspinatus. Severe AC arthrosis. Biceps tendinosis.  Assessment/Plan R shoulder RCT, AC arthrosis Full thickness tear, supraspinatus, infraspinatus. Associated AC arthrosis.  In terms of the shoulder, we discussed a rotator cuff repair and distal clavicle resection.  I had a long discussion with the patient concerning the risks and benefits of a right rotator cuff repair, including bleeding, infection, prolonged postoperative recovery, which may require 3 to 5 months until maximum medical improvement. Overnight procedure with initiation of early passive range of motion within physical therapy. Avoid any active motion for the first 6 weeks. This is all in an effort to avoid recurrent tear of the rotator cuff and adhesive capsulitis. Return to work without use of the arm can be obtained following 2 weeks. However, driving will be a challenge. We also discussed the possibility of requiring implants including bone anchors, as well as an Allograft patch graft if a massive rotator cuff tear is encountered. Removal of any bones for spurs as well as bursitis will be performed during the procedure. Also any associated anesthetic complications as well.   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 No PE. This was during her pregnancy, none since. She was on Heparin at that time. We may have her post operatively on released aspirin.  We discussed  her problems with her at length and her husband. She needs time out of work. Overnight stay is probable.  I had a long discussion with the patient concerning the risks and benefits of a rotator cuff repair, including bleeding, infection, prolonged postoperative  recovery, which may require 3 to 5 months until maximum medical improvement. Overight procedure with initiation of early passive range of motion within physical therapy. Avoid any active motion for the first six weeks. This is all in an effort to avoid recurrent tear of the rotator cuff and adhesive capsulitis. Return to work without use of the arm can be obtained following two weeks. However, driving will be a challenge. We also discussed the possibility of requiring implants including bone anchors,as well as an Allograft patch graft if a massive rotator cuff tear is encountered. Removal of any bones for spurs as well as bursitis will be performed during the procedure and also any associated anesthetic complications as well.  Plan right shoulder mini-open RCR, DCR  BISSELL, Molly M. PA-C for Dr. Shelle Dean 09/09/2014, 12:40 PM

## 2014-09-13 NOTE — Anesthesia Preprocedure Evaluation (Signed)
Anesthesia Evaluation  Patient identified by MRN, date of birth, ID band Patient awake    Reviewed: Allergy & Precautions, H&P , NPO status , Patient's Chart, lab work & pertinent test results  Airway Mallampati: II TM Distance: >3 FB Neck ROM: Full    Dental no notable dental hx.    Pulmonary sleep apnea ,  breath sounds clear to auscultation  Pulmonary exam normal       Cardiovascular hypertension, Pt. on medications Rhythm:Regular Rate:Normal     Neuro/Psych negative neurological ROS  negative psych ROS   GI/Hepatic negative GI ROS, Neg liver ROS,   Endo/Other  negative endocrine ROS  Renal/GU negative Renal ROS  negative genitourinary   Musculoskeletal  (+) Arthritis -,   Abdominal (+) + obese,   Peds negative pediatric ROS (+)  Hematology  (+) anemia ,   Anesthesia Other Findings   Reproductive/Obstetrics negative OB ROS                           Anesthesia Physical Anesthesia Plan  ASA: II  Anesthesia Plan: General   Post-op Pain Management:    Induction: Intravenous  Airway Management Planned: Oral ETT  Additional Equipment:   Intra-op Plan:   Post-operative Plan: Extubation in OR  Informed Consent: I have reviewed the patients History and Physical, chart, labs and discussed the procedure including the risks, benefits and alternatives for the proposed anesthesia with the patient or authorized representative who has indicated his/her understanding and acceptance.   Dental advisory given  Plan Discussed with: CRNA  Anesthesia Plan Comments:         Anesthesia Quick Evaluation

## 2014-09-13 NOTE — Interval H&P Note (Signed)
History and Physical Interval Note:  09/13/2014 12:30 PM  Molly Dean  has presented today for surgery, with the diagnosis of rotator cuff tear and ac arthrosis on the right  The various methods of treatment have been discussed with the patient and family. After consideration of risks, benefits and other options for treatment, the patient has consented to  Procedure(s): RIGHT SHOULDER MINI OPEN ROTATOR CUFF REPAIR WITH DISTAL CLAVICLE RESECTION (Right) as a surgical intervention .  The patient's history has been reviewed, patient examined, no change in status, stable for surgery.  I have reviewed the patient's chart and labs.  Questions were answered to the patient's satisfaction.     Soliana Kitko C

## 2014-09-14 DIAGNOSIS — M75101 Unspecified rotator cuff tear or rupture of right shoulder, not specified as traumatic: Secondary | ICD-10-CM | POA: Diagnosis not present

## 2014-09-14 LAB — BASIC METABOLIC PANEL
ANION GAP: 16 — AB (ref 5–15)
BUN: 11 mg/dL (ref 6–23)
CALCIUM: 9.3 mg/dL (ref 8.4–10.5)
CHLORIDE: 99 meq/L (ref 96–112)
CO2: 24 mEq/L (ref 19–32)
Creatinine, Ser: 0.71 mg/dL (ref 0.50–1.10)
GFR calc non Af Amer: >90 mL/min (ref >90)
Glucose, Bld: 142 mg/dL — ABNORMAL HIGH (ref 70–99)
Potassium: 3.8 mEq/L (ref 3.7–5.3)
Sodium: 139 mEq/L (ref 137–147)

## 2014-09-14 LAB — CBC
HCT: 35.7 % — ABNORMAL LOW (ref 36.0–46.0)
Hemoglobin: 11.5 g/dL — ABNORMAL LOW (ref 12.0–15.0)
MCH: 23.6 pg — AB (ref 26.0–34.0)
MCHC: 32.2 g/dL (ref 30.0–36.0)
MCV: 73.3 fL — ABNORMAL LOW (ref 78.0–100.0)
PLATELETS: 225 10*3/uL (ref 150–400)
RBC: 4.87 MIL/uL (ref 3.87–5.11)
RDW: 14.7 % (ref 11.5–15.5)
WBC: 7.7 10*3/uL (ref 4.0–10.5)

## 2014-09-14 MED ORDER — OXYCODONE-ACETAMINOPHEN 5-325 MG PO TABS: 1.0000 | ORAL_TABLET | ORAL | Status: DC | PRN

## 2014-09-14 NOTE — Progress Notes (Signed)
Pt stable, scripts, d/c instructions given with no questions/concerns voiced by pt or family member.  Pt transported via wheelchair to private vehicle by NT and family member. 

## 2014-09-14 NOTE — Evaluation (Signed)
Occupational Therapy Evaluation Patient Details Name: Molly Dean MRN: [id number] DOB: Apr 08, 1961 Today's Date: 09/14/2014    History of Present Illness S/P R RCR with clavicle resection   Clinical Impression   All education completed.  Pt has excellent support at home.  Instructed in ADL, sling use, positioning, NWB status, and elbow to hand AROM.  Pt and husband are confident they can manage at home and are ready for discharge.    Follow Up Recommendations  Supervision - Intermittent    Equipment Recommendations  None recommended by OT    Recommendations for Other Services       Precautions / Restrictions Precautions Precautions: Shoulder Type of Shoulder Precautions: NWB Shoulder Interventions: Roe Coombson joy ultra sling;At all times;Off for dressing/bathing/exercises Precaution Booklet Issued: Yes (comment) Restrictions Weight Bearing Restrictions: Yes RUE Weight Bearing: Non weight bearing      Mobility Bed Mobility Overal bed mobility: Modified Independent             General bed mobility comments: HOB up  Transfers Overall transfer level: Needs assistance   Transfers: Sit to/from Stand Sit to Stand: Supervision         General transfer comment: slightly unsteady due to pain meds    Balance                                            ADL Overall ADL's : Needs assistance/impaired Eating/Feeding: Set up;Sitting   Grooming: Wash/dry hands;Supervision/safety;Standing   Upper Body Bathing: Minimal assitance;Sitting   Lower Body Bathing: Minimal assistance;Sit to/from stand   Upper Body Dressing : Moderate assistance;Sitting   Lower Body Dressing: Minimal assistance;Sit to/from stand   Toilet Transfer: Retail bankerupervision/safety;Regular Toilet;Ambulation   Toileting- Clothing Manipulation and Hygiene: Supervision/safety;Sit to/from stand       Functional mobility during ADLs: Supervision/safety General ADL Comments: Instructed in  one handed techniques for bathing and dressing, instructed pt and husband in donning and doffing sling, bed and chair positioning and AROM of elbow to hand on R.     Vision                     Perception     Praxis      Pertinent Vitals/Pain Pain Assessment: Faces Faces Pain Scale: Hurts little more Pain Location: R shoulder Pain Descriptors / Indicators: Aching Pain Intervention(s): Repositioned;Monitored during session;Patient requesting pain meds-RN notified     Hand Dominance Right   Extremity/Trunk Assessment Upper Extremity Assessment Upper Extremity Assessment: RUE deficits/detail RUE Deficits / Details: full AROM elbow to hand, performed x 10 each RUE: Unable to fully assess due to immobilization RUE Coordination: decreased gross motor   Lower Extremity Assessment Lower Extremity Assessment: Overall WFL for tasks assessed   Cervical / Trunk Assessment Cervical / Trunk Assessment: Normal   Communication Communication Communication: No difficulties   Cognition Arousal/Alertness: Awake/alert Behavior During Therapy: WFL for tasks assessed/performed Overall Cognitive Status: Within Functional Limits for tasks assessed                     General Comments       Exercises       Shoulder Instructions      Home Living Family/patient expects to be discharged to:: Private residence Living Arrangements: Spouse/significant other Available Help at Discharge: Family;Available 24 hours/day  Bathroom Shower/Tub: Producer, television/film/videoWalk-in shower   Bathroom Toilet: Standard     Home Equipment: None   Additional Comments: has a resin chair she can use as shower seat if necessary      Prior Functioning/Environment Level of Independence: Independent        Comments: works at [employer]    OT Diagnosis:     OT Problem List:     OT Treatment/Interventions:      OT Goals(Current goals can be found in the care plan section) Acute Rehab OT  Goals Patient Stated Goal: regain use of R UE  OT Frequency:     Barriers to D/C:            Co-evaluation              End of Session Nurse Communication: Patient requests pain meds (ready for discharge)  Activity Tolerance: Patient tolerated treatment well Patient left: in chair;with call bell/phone within reach;with family/visitor present   Time: 5621-30860844-0920 OT Time Calculation (min): 36 min Charges:  OT General Charges $OT Visit: 1 Procedure OT Evaluation $Initial OT Evaluation Tier I: 1 Procedure OT Treatments $Self Care/Home Management : 8-22 mins $Therapeutic Exercise: 8-22 mins G-Codes: OT G-codes **NOT FOR INPATIENT CLASS** Functional Assessment Tool Used: clinical judgement Functional Limitation: Self care Self Care Current Status ([id number](G8987): At least 1 percent but less than 20 percent impaired, limited or restricted Self Care Goal Status (N6295(G8988): At least 1 percent but less than 20 percent impaired, limited or restricted Self Care Discharge Status [phone](G8989): At least 1 percent but less than 20 percent impaired, limited or restricted  Evern BioMayberry, Lieutenant Abarca Lynn 09/14/2014, 9:38 AM [phone]

## 2014-09-14 NOTE — Op Note (Signed)
NAME:  Molly Dean, Molly Dean            ACCOUNT NO.:  [id number]  MEDICAL RECORD NO.:  [id number]  LOCATION:  1604                         [facility]  PHYSICIAN:  Molly Dean, M.D.    DATE OF BIRTH:  1961/03/14  DATE OF PROCEDURE: DATE OF DISCHARGE:                              OPERATIVE REPORT   PREOPERATIVE DIAGNOSES:  Rotator cuff tear, retracted acromioclavicular arthrosis, right shoulder.  POSTOPERATIVE DIAGNOSES:  Rotator cuff tear, retracted acromioclavicular arthrosis, right shoulder.  PROCEDURES PERFORMED: 1. Mini-open rotator cuff repair, subacromial decompression. 2. Distal clavicle resection.  ANESTHESIA:  General.  SURGEON:  Molly Dean, M.D.  ASSISTANT:  Molly PocheJacqueline Bissell, PA was used for traction of the arm and placement of the suture anchors.  HISTORY:  A 53 year old, retracted tear of the rotator cuff, distal clavicle, AC arthrosis indicated for distal clavicle resection and acromioplasty and repair of the rotator cuff, infraspinatus and supraspinatus retracted.  Risk and benefits discussed including bleeding, infection, damage to neurovascular structures, suboptimal range of motion, prolonged recovery, infection, DVT, PE, etc.  TECHNIQUE:  With the patient in supine and beach-chair position, after induction of adequate general anesthesia and 2 g Kefzol, she was placed upright in beach-chair position.  The right shoulder was prepped and draped in usual sterile fashion.  Surgical marker was utilized to delineate the acromion, AC joint, coracoid.  An incision was made over the distal clavicle and the distal acromion.  Subcutaneous tissue was dissected.  Electrocautery was utilized to achieve hemostasis.  Raphe between the anterolateral heads of the rotator cuff were identified, divided in line with skin incision.  After infiltration with course and Marcaine with epinephrine, spur off the anterolateral aspect of the acromion was removed with 3-mm  Kerrison, detached the ligament, retracted there supraspinatus and infraspinatus were noted.  Retracted back to midportion of the humeral head.  I fashioned a trough with a Beyer rongeur lateral to the articular surface.  Digitally lysed adhesions.  Mobilized the cuff posteriorly and anteriorly.  We then incised the capsule over the distal clavicle.  Skeletonized the distal clavicle, protected anteriorly, posteriorly, and inferiorly.  Used an oscillating saw to remove a cm of the distal clavicle.  We then undercut it with a 3-mm Kerrison protecting the rotator cuff at all times.  There was a large spur projecting into the supraspinatus.  I then copiously irrigated that portion, and bone wax was placed on distal clavicle.  The coracoclavicular ligaments were intact.  I closed that capsule with 1 Vicryl.  I then proceeded with repair of the cuff.  We placed 2 swivel locks just lateral to the articular surface after placing a provisional pilot hole with an awl, one anteriorly and one more in midportion of the greater tuberosity.  We then inserted our swivel locks.  We passed them with fiber tape through the infraspinatus and supraspinatus and then advanced the supraspinatus laterally and the infraspinatus anteriorly and laterally.  Crossed the fiber tape and over the greater tuberosity placed 2 pilot holes, placed them through push locks, inserted into the greater tuberosity taking care not to over tension them.  We had full coverage of the head noted. An excellent repair noted.  Wound copiously irrigated.  No active bleeding.  We repaired the raphe with 1 Vicryl interrupted figure-of-eight sutures over the top and through the acromion, subcu with 2-0 Vicryl.  The rotator cuff was repaired with the arm in the neutral position, skin with subcuticular Prolene.  Sterile Steri-Strips were applied.  Abduction pillow placed.  Extubated without difficulty, and transported to the recovery room in  satisfactory condition.  The patient tolerated the procedure well.  No complications.  Minimal blood loss.     Molly EveryJeffrey Meyah Dean, M.D.     Molly Dean/MEDQ  D:  09/13/2014  T:  09/14/2014  Job:  [id number]

## 2014-09-14 NOTE — Progress Notes (Signed)
Subjective: 1 Day Post-Op Procedure(s) (LRB): RIGHT SHOULDER MINI OPEN ROTATOR CUFF REPAIR WITH DISTAL CLAVICLE RESECTION (Right) Patient reports pain as moderate in Right shoulder. Tolerating PO's well. Denies SOb, Numbness or tingling, CP, or F/C. She and her husband report a good night and are ready to D/c home.  Objective: Vital signs in last 24 hours: Temp:  [96.6 F (35.9 C)-98 F (36.7 C)] 97.6 F (36.4 C) (10/17 0514) Pulse Rate:  [68-105] 102 (10/17 0514) Resp:  [12-16] 14 (10/17 0514) BP: (111-147)/(64-96) 134/75 mmHg (10/17 0514) SpO2:  [98 %-100 %] 100 % (10/17 0514) Weight:  [103.42 kg (228 lb)] 103.42 kg (228 lb) (10/16 1709)  Intake/Output from previous day: 10/16 0701 - 10/17 0700 In: 2540 [I.V.:2440; IV Piggyback:100] Out: 1700 [Urine:1650; Blood:50] Intake/Output this shift:     Recent Labs  09/11/14 0900 09/13/14 1857 09/14/14 0420  HGB 11.9* 11.0* 11.5*    Recent Labs  09/13/14 1857 09/14/14 0420  WBC 6.0 7.7  RBC 4.76 4.87  HCT 34.8* 35.7*  PLT 216 225    Recent Labs  09/11/14 0900 09/13/14 1857 09/14/14 0420  NA 143  --  139  K 3.7  --  3.8  CL 102  --  99  CO2 29  --  24  BUN 13  --  11  CREATININE 0.69 0.76 0.71  GLUCOSE 84  --  142*  CALCIUM 9.6  --  9.3   No results found for this basename: LABPT, INR,  in the last 72 hours  Well nourished. Alert and oriented x3. RRR, Lungs clear, BS x4. Abdomen soft and non tender. Right Calf soft and non tender. Right shoulder dressing C/D/I. No signs of infection.  Right UE neurovascular intact.  Assessment/Plan: 1 Day Post-Op Procedure(s) (LRB): RIGHT SHOULDER MINI OPEN ROTATOR CUFF REPAIR WITH DISTAL CLAVICLE RESECTION (Right) D/C home today with family Take medications as directed Follow up in office with Dr. Shelle IronBeane Continue Sling ROM of elbow and wrist Leave dressing in place Follow instrcutions  Ulises Wolfinger L 09/14/2014, 8:20 AM

## 2014-09-16 ENCOUNTER — Encounter (HOSPITAL_COMMUNITY): Payer: Self-pay | Admitting: Specialist

## 2014-09-26 NOTE — Discharge Summary (Signed)
Patient ID: Molly Dean MRN: [id number] DOB/AGE: Feb 26, 1961 53 y.o.  Admit date: 09/13/2014 Discharge date: 09/26/2014  Admission Diagnoses:  Principal Problem:   Right rotator cuff tear Active Problems:   Arthritis of right acromioclavicular joint   Complete rotator cuff tear   Discharge Diagnoses:  Same  Past Medical History  Diagnosis Date  . Hypertension   . Elevated liver enzymes   . H/O bronchitis   . Arthritis   . RCT (rotator cuff tear)     rt shoulder  . Lactose intolerance   . Urgency of urination   . Anemia   . Sleep apnea     has not used x 2 yrs    Surgeries: Procedure(s): RIGHT SHOULDER MINI OPEN ROTATOR CUFF REPAIR WITH DISTAL CLAVICLE RESECTION on 09/13/2014   Consultants:    Discharged Condition: Improved  Hospital Course: Molly Dean is an 53 y.o. female who was admitted 09/13/2014 for operative treatment ofRight rotator cuff tear. Patient has severe unremitting pain that affects sleep, daily activities, and work/hobbies. After pre-op clearance the patient was taken to the operating room on 09/13/2014 and underwent  Procedure(s): RIGHT SHOULDER MINI OPEN ROTATOR CUFF REPAIR WITH DISTAL CLAVICLE RESECTION.    Patient was given perioperative antibiotics:  Anti-infectives   Start     Dose/Rate Route Frequency Ordered Stop   09/14/14 0600  ceFAZolin (ANCEF) IVPB 2 g/50 mL premix  Status:  Discontinued     2 g 100 mL/hr over 30 Minutes Intravenous On call to O.R. 09/13/14 1218 09/13/14 1224   09/14/14 0600  clindamycin (CLEOCIN) IVPB 900 mg  Status:  Discontinued     900 mg 100 mL/hr over 30 Minutes Intravenous On call to O.R. 09/13/14 1218 09/13/14 1224   09/13/14 2030  ceFAZolin (ANCEF) IVPB 2 g/50 mL premix     2 g 100 mL/hr over 30 Minutes Intravenous Every 6 hours 09/13/14 1722 09/14/14 0851   09/13/14 1458  polymyxin B 500,000 Units, bacitracin 50,000 Units in sodium chloride irrigation 0.9 % 500 mL irrigation  Status:  Discontinued        As needed 09/13/14 1458 09/13/14 1600   09/13/14 1230  ceFAZolin (ANCEF) IVPB 2 g/50 mL premix     2 g 100 mL/hr over 30 Minutes Intravenous On call to O.R. 09/13/14 1224 09/13/14 1431   09/13/14 1230  clindamycin (CLEOCIN) IVPB 900 mg     900 mg 100 mL/hr over 30 Minutes Intravenous On call to O.R. 09/13/14 1224 09/13/14 1510       Patient was given sequential compression devices, early ambulation, and chemoprophylaxis to prevent DVT.  Patient benefited maximally from hospital stay and there were no complications.    Recent vital signs: No data found.    Recent laboratory studies: No results found for this basename: WBC, HGB, HCT, PLT, NA, K, CL, CO2, BUN, CREATININE, GLUCOSE, PT, INR, CALCIUM, 2,  in the last 72 hours   Discharge Medications:     Medication List    STOP taking these medications       metaxalone 800 MG tablet  Commonly known as:  SKELAXIN     VITAMIN B-12 IJ      TAKE these medications       albuterol 108 (90 BASE) MCG/ACT inhaler  Commonly known as:  PROVENTIL HFA;VENTOLIN HFA  Inhale 2 puffs into the lungs every 6 (six) hours as needed for wheezing or shortness of breath.     aspirin EC 81 MG tablet  Take 81  mg by mouth at bedtime.     azithromycin 250 MG tablet  Commonly known as:  ZITHROMAX  Take 250 mg by mouth. Take two tablets first day then one tablet daily until gone.     cefUROXime 500 MG tablet  Commonly known as:  CEFTIN  Take 500 mg by mouth 2 (two) times daily.     gabapentin 300 MG capsule  Commonly known as:  NEURONTIN  Take 300 mg by mouth 3 (three) times daily.     HYDROcodone-acetaminophen 5-325 MG per tablet  Commonly known as:  NORCO/VICODIN  Take 1-2 tablets by mouth every 4 (four) hours as needed.     ICY HOT 5 % Pads  Generic drug:  Menthol (Topical Analgesic)  Apply 1 application topically at bedtime as needed (shoulder and knee pain.).     Iron 325 (65 FE) MG Tabs  Take 1 tablet by mouth at bedtime.      lisinopril-hydrochlorothiazide 20-25 MG per tablet  Commonly known as:  PRINZIDE,ZESTORETIC  Take 1 tablet by mouth at bedtime.     loratadine 10 MG tablet  Commonly known as:  CLARITIN  Take 10 mg by mouth every morning.     meloxicam 15 MG tablet  Commonly known as:  MOBIC  Take 15 mg by mouth at bedtime.     methocarbamol 500 MG tablet  Commonly known as:  ROBAXIN  Take 1 tablet (500 mg total) by mouth 3 (three) times daily between meals as needed for muscle spasms.     OVER THE COUNTER MEDICATION  Place 1 drop into both eyes once as needed (drye eyes.).     oxyCODONE-acetaminophen 7.5-325 MG per tablet  Commonly known as:  PERCOCET  Take 1 tablet by mouth every 4 (four) hours as needed for pain.     phentermine 37.5 MG tablet  Commonly known as:  ADIPEX-P  Take 37.5 mg by mouth every morning.     tolterodine 4 MG 24 hr capsule  Commonly known as:  DETROL LA  Take 4 mg by mouth at bedtime.        Diagnostic Studies: Dg Chest 2 View  09/11/2014   CLINICAL DATA:  Hypertension. Preoperative for right shoulder surgery.  EXAM: CHEST  2 VIEW  COMPARISON:  None  FINDINGS: Mild to moderate thoracic spondylosis.  The lungs appear clear. Cardiac and mediastinal contours normal. No pleural effusion identified.  IMPRESSION: 1. No active cardiopulmonary disease. 2. Mild thoracic spondylosis.   Electronically Signed   By: Herbie Baltimore M.D.   On: 09/11/2014 10:52    Disposition: 01-Home or Self Care      Discharge Instructions   Call MD / Call 911    Complete by:  As directed   If you experience chest pain or shortness of breath, CALL 911 and be transported to the hospital emergency room.  If you develope a fever above 101 F, pus (white drainage) or increased drainage or redness at the wound, or calf pain, call your surgeon's office.     Constipation Prevention    Complete by:  As directed   Drink plenty of fluids.  Prune juice may be helpful.  You may use a stool softener,  such as Colace (over the counter) 100 mg twice a day.  Use MiraLax (over the counter) for constipation as needed.     Diet - low sodium heart healthy    Complete by:  As directed      Increase activity slowly as  tolerated    Complete by:  As directed            Follow-up Information   Follow up with Grabiela Wohlford C, MD In 2 weeks. (For suture removal)    Specialty:  Orthopedic Surgery   Contact information:   [address] [place] [address] [phone]        Signed: Javier DockerBEANE,Sebrina Kessner C 09/26/2014, 11:18 AM

## 2015-08-27 IMAGING — CR DG CHEST 2V
2 series · 2 of 2 positions shown · non-contrast
Comparison: None

CLINICAL DATA: Hypertension. Preoperative for right shoulder
surgery.

EXAM:
CHEST  2 VIEW

[w chest pa]
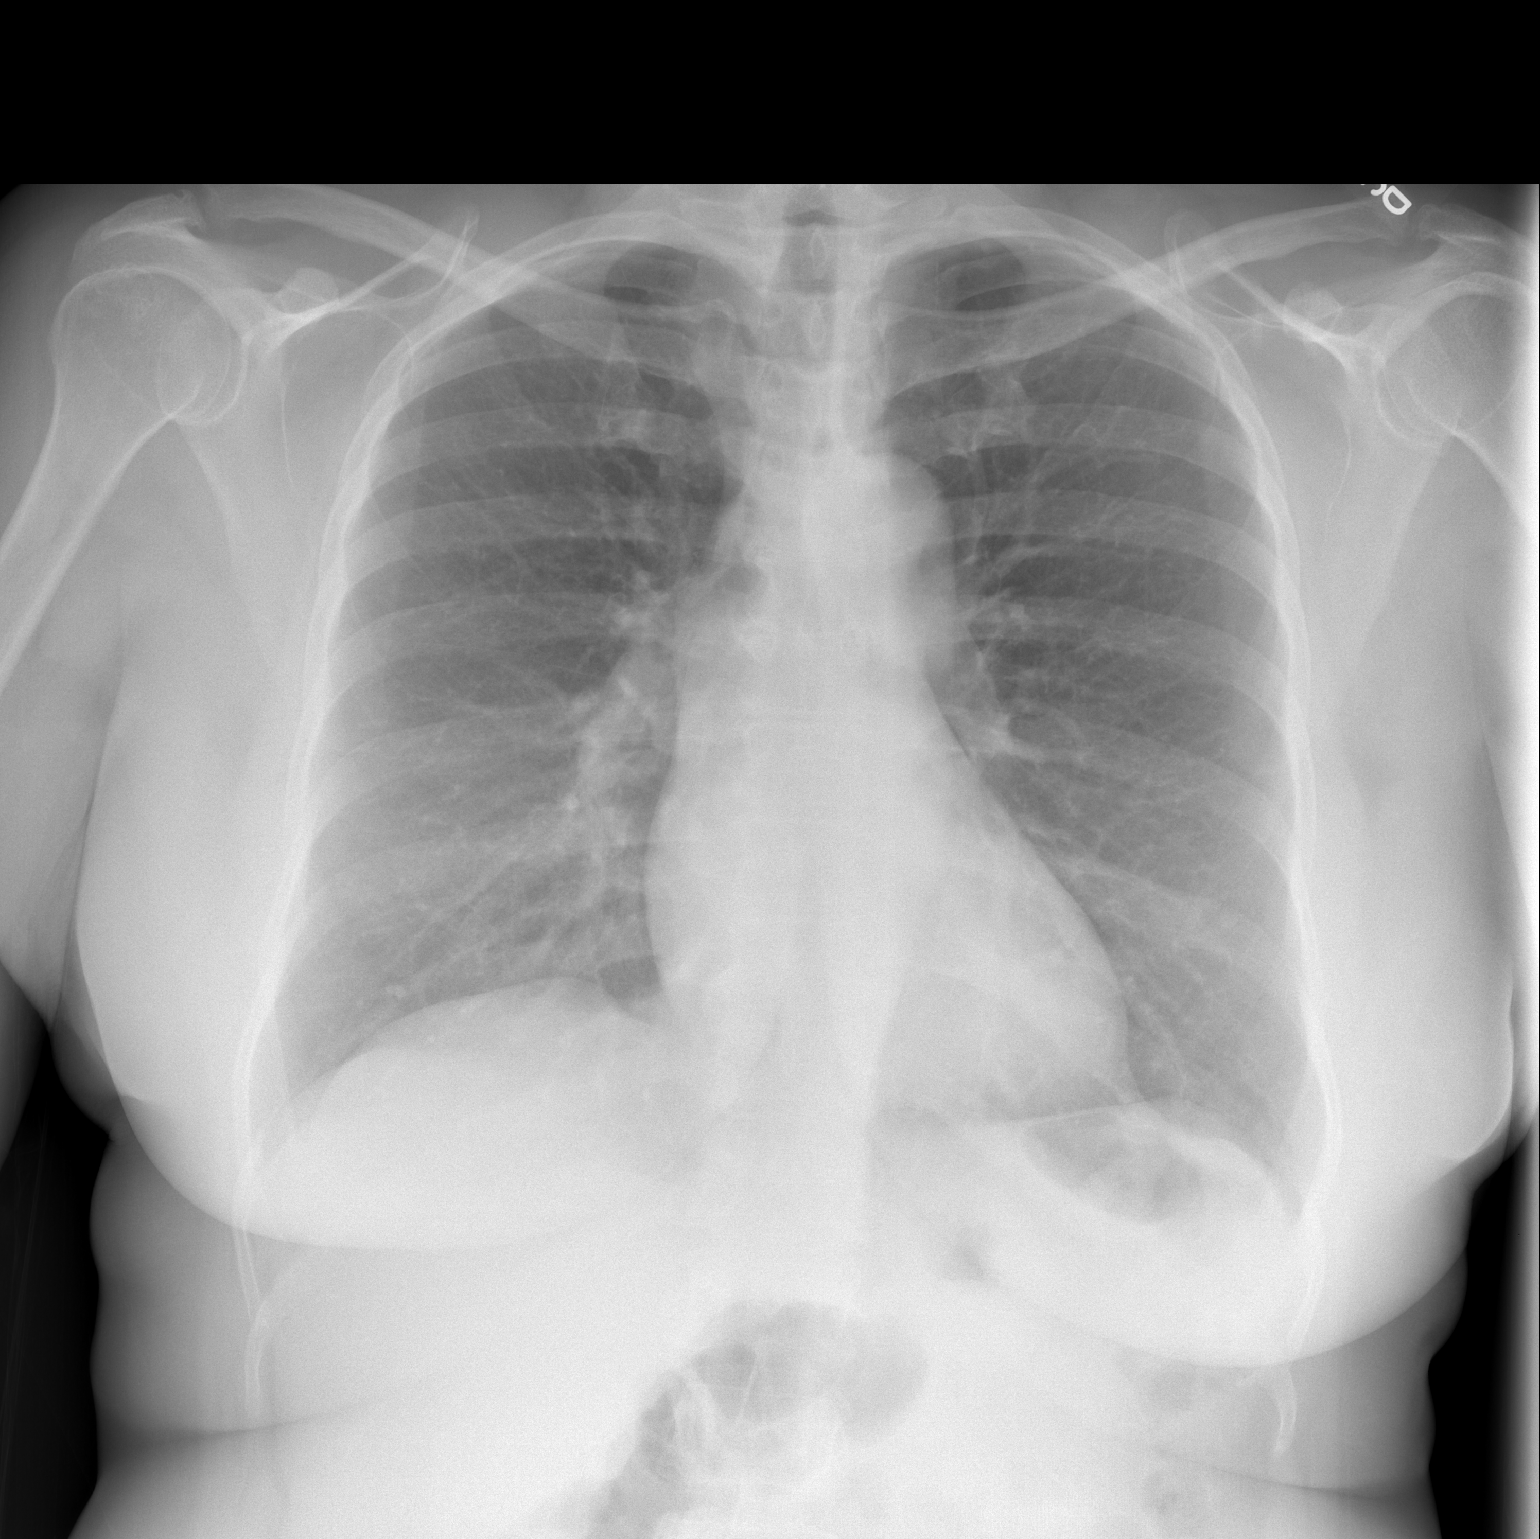

[w chest lat]
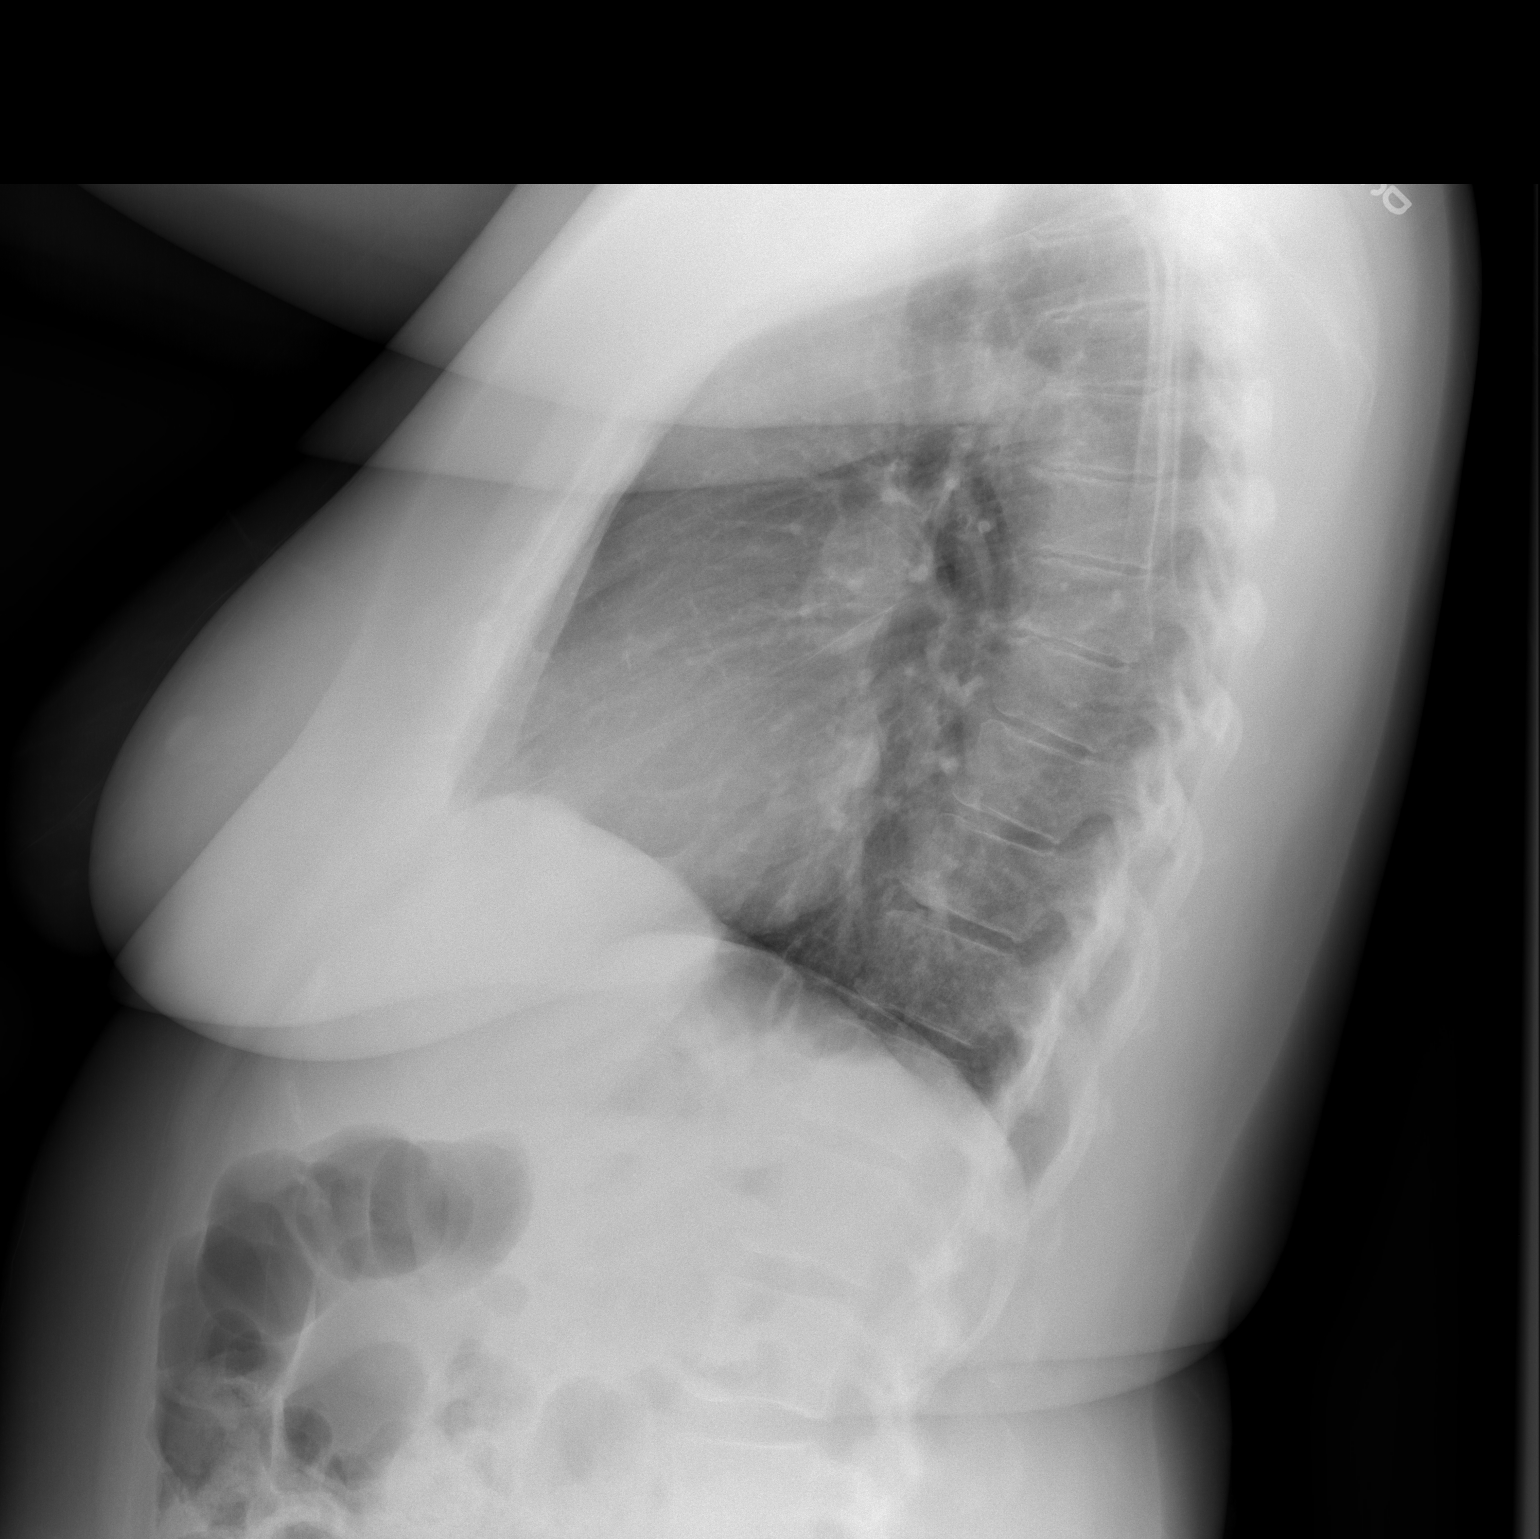

[2 of 2 positions shown; findings below may reference images not displayed]

FINDINGS: Mild to moderate thoracic spondylosis.

The lungs appear clear. Cardiac and mediastinal contours normal. No
pleural effusion identified.
IMPRESSION: 1. No active cardiopulmonary disease.
2. Mild thoracic spondylosis.
